# Patient Record
Sex: Female | Born: 1995 | Hispanic: Yes | Marital: Single | Smoking: Current every day smoker
Health system: Southern US, Community
[De-identification: ages and names within clinical notes are randomized; demographics above are authoritative.]

---
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
## 2018-12-15 ENCOUNTER — Other Ambulatory Visit: Payer: Self-pay

## 2018-12-15 ENCOUNTER — Emergency Department
Admission: EM | Admit: 2018-12-15 | Discharge: 2018-12-16 | Disposition: A | Discharge status: Home / Self Care | Payer: Medicaid Other | Attending: Emergency Medicine | Admitting: Emergency Medicine

## 2018-12-15 ENCOUNTER — Encounter: Payer: Self-pay | Admitting: Emergency Medicine

## 2018-12-15 DIAGNOSIS — Y907 Blood alcohol level of 200-239 mg/100 ml: Secondary | ICD-10-CM | POA: Insufficient documentation

## 2018-12-15 DIAGNOSIS — F10929 Alcohol use, unspecified with intoxication, unspecified: Secondary | ICD-10-CM | POA: Diagnosis not present

## 2018-12-15 DIAGNOSIS — E860 Dehydration: Secondary | ICD-10-CM | POA: Insufficient documentation

## 2018-12-15 DIAGNOSIS — R112 Nausea with vomiting, unspecified: Secondary | ICD-10-CM | POA: Diagnosis present

## 2018-12-15 DIAGNOSIS — F17210 Nicotine dependence, cigarettes, uncomplicated: Secondary | ICD-10-CM | POA: Insufficient documentation

## 2018-12-15 LAB — CBC WITH DIFFERENTIAL/PLATELET
Abs Immature Granulocytes: 0.09 10*3/uL — ABNORMAL HIGH (ref 0.00–0.07)
Basophils Absolute: 0.1 10*3/uL (ref 0.0–0.1)
Basophils Relative: 1 %
Eosinophils Absolute: 0.2 10*3/uL (ref 0.0–0.5)
Eosinophils Relative: 2 %
HCT: 40.4 % (ref 36.0–46.0)
Hemoglobin: 13.2 g/dL (ref 12.0–15.0)
Immature Granulocytes: 1 %
Lymphocytes Relative: 30 %
Lymphs Abs: 3.3 10*3/uL (ref 0.7–4.0)
MCH: 28.6 pg (ref 26.0–34.0)
MCHC: 32.7 g/dL (ref 30.0–36.0)
MCV: 87.4 fL (ref 80.0–100.0)
Monocytes Absolute: 0.7 10*3/uL (ref 0.1–1.0)
Monocytes Relative: 6 %
Neutro Abs: 6.7 10*3/uL (ref 1.7–7.7)
Neutrophils Relative %: 60 %
Platelets: 393 10*3/uL (ref 150–400)
RBC: 4.62 MIL/uL (ref 3.87–5.11)
RDW: 13.2 % (ref 11.5–15.5)
WBC: 11 10*3/uL — ABNORMAL HIGH (ref 4.0–10.5)
nRBC: 0 % (ref 0.0–0.2)

## 2018-12-15 LAB — COMPREHENSIVE METABOLIC PANEL
ALT: 21 U/L (ref 0–44)
AST: 17 U/L (ref 15–41)
Albumin: 4.2 g/dL (ref 3.5–5.0)
Alkaline Phosphatase: 111 U/L (ref 38–126)
Anion gap: 11 (ref 5–15)
BUN: 11 mg/dL (ref 6–20)
CO2: 22 mmol/L (ref 22–32)
Calcium: 8.7 mg/dL — ABNORMAL LOW (ref 8.9–10.3)
Chloride: 110 mmol/L (ref 98–111)
Creatinine, Ser: 0.53 mg/dL (ref 0.44–1.00)
GFR calc Af Amer: >60 mL/min (ref >60)
GFR calc non Af Amer: >60 mL/min (ref >60)
Glucose, Bld: 109 mg/dL — ABNORMAL HIGH (ref 70–99)
Potassium: 3.3 mmol/L — ABNORMAL LOW (ref 3.5–5.1)
Sodium: 143 mmol/L (ref 135–145)
Total Bilirubin: 0.6 mg/dL (ref 0.3–1.2)
Total Protein: 8.5 g/dL — ABNORMAL HIGH (ref 6.5–8.1)

## 2018-12-15 LAB — HCG, QUANTITATIVE, PREGNANCY: hCG, Beta Chain, Quant, S: 1 m[IU]/mL (ref <5)

## 2018-12-15 LAB — ETHANOL: Alcohol, Ethyl (B): 200 mg/dL — ABNORMAL HIGH (ref <10)

## 2018-12-15 MED ORDER — SODIUM CHLORIDE 0.9 % IV BOLUS
1000.0000 mL | Freq: Once | INTRAVENOUS | Status: AC
Start: 2018-12-15 — End: 2018-12-16
  Administered 2018-12-15: 1000 mL via INTRAVENOUS

## 2018-12-15 MED ORDER — AMMONIA AROMATIC IN INHA
RESPIRATORY_TRACT | Status: AC
  Filled 2018-12-15: qty 10

## 2018-12-15 MED ORDER — AMMONIA AROMATIC IN INHA
0.3000 mL | Freq: Once | RESPIRATORY_TRACT | Status: AC
  Administered 2018-12-15: 22:00:00 0.3 mL via RESPIRATORY_TRACT

## 2018-12-15 MED ORDER — ONDANSETRON HCL 4 MG/2ML IJ SOLN
4.0000 mg | Freq: Once | INTRAMUSCULAR | Status: DC
  Filled 2018-12-15: qty 2
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
## 2018-12-15 NOTE — ED Triage Notes (Signed)
Pt arrives with husband with ETOH on board. Pt split one 750 oz bottle of tequila and six beers with her husband. Pt has vomited x 3 prior to arrival. Pt ate pizza this Am with some chicken wings and various other things from [employer]. Pt is unresponsive to sternal stimulus at this time.

## 2018-12-15 NOTE — ED Notes (Signed)
Pt pulled out IV. EDP made aware.

## 2018-12-16 NOTE — ED Provider Notes (Signed)
Patient ambulating in hall with a steady gait.  Appears clinically sober as well as stable and appropriate for discharge home.   Willy Eddy, MD 12/16/18 [phone]

## 2019-02-13 ENCOUNTER — Emergency Department
Admission: EM | Admit: 2019-02-13 | Discharge: 2019-02-13 | Disposition: A | Discharge status: Home / Self Care | Payer: Medicaid Other | Attending: Emergency Medicine | Admitting: Emergency Medicine

## 2019-02-13 ENCOUNTER — Other Ambulatory Visit: Payer: Self-pay

## 2019-02-13 ENCOUNTER — Encounter: Payer: Self-pay | Admitting: Emergency Medicine

## 2019-02-13 DIAGNOSIS — R509 Fever, unspecified: Secondary | ICD-10-CM | POA: Diagnosis not present

## 2019-02-13 LAB — CBC
HCT: 38.7 % (ref 36.0–46.0)
Hemoglobin: 12.7 g/dL (ref 12.0–15.0)
MCH: 28.8 pg (ref 26.0–34.0)
MCHC: 32.8 g/dL (ref 30.0–36.0)
MCV: 87.8 fL (ref 80.0–100.0)
Platelets: 302 10*3/uL (ref 150–400)
RBC: 4.41 MIL/uL (ref 3.87–5.11)
RDW: 13.3 % (ref 11.5–15.5)
WBC: 4.3 10*3/uL (ref 4.0–10.5)
nRBC: 0 % (ref 0.0–0.2)

## 2019-02-13 LAB — URINALYSIS, COMPLETE (UACMP) WITH MICROSCOPIC
Bilirubin Urine: NEGATIVE
Glucose, UA: NEGATIVE mg/dL
Hgb urine dipstick: NEGATIVE
Ketones, ur: NEGATIVE mg/dL
Leukocytes,Ua: NEGATIVE
Nitrite: NEGATIVE
Protein, ur: NEGATIVE mg/dL
Specific Gravity, Urine: 1.029 (ref 1.005–1.030)
pH: 6 (ref 5.0–8.0)

## 2019-02-13 LAB — COMPREHENSIVE METABOLIC PANEL
ALT: 26 U/L (ref 0–44)
AST: 24 U/L (ref 15–41)
Albumin: 4.2 g/dL (ref 3.5–5.0)
Alkaline Phosphatase: 86 U/L (ref 38–126)
Anion gap: 11 (ref 5–15)
BUN: 11 mg/dL (ref 6–20)
CO2: 24 mmol/L (ref 22–32)
Calcium: 8.7 mg/dL — ABNORMAL LOW (ref 8.9–10.3)
Chloride: 106 mmol/L (ref 98–111)
Creatinine, Ser: 0.65 mg/dL (ref 0.44–1.00)
GFR calc Af Amer: >60 mL/min (ref >60)
GFR calc non Af Amer: >60 mL/min (ref >60)
Glucose, Bld: 117 mg/dL — ABNORMAL HIGH (ref 70–99)
Potassium: 3.5 mmol/L (ref 3.5–5.1)
Sodium: 141 mmol/L (ref 135–145)
Total Bilirubin: 0.5 mg/dL (ref 0.3–1.2)
Total Protein: 7.8 g/dL (ref 6.5–8.1)

## 2019-02-13 LAB — HCG, QUANTITATIVE, PREGNANCY: hCG, Beta Chain, Quant, S: <1 m[IU]/mL (ref <5)

## 2019-02-13 LAB — POCT PREGNANCY, URINE: Preg Test, Ur: NEGATIVE

## 2019-02-13 LAB — LIPASE, BLOOD: Lipase: 31 U/L (ref 11–51)

## 2019-02-13 MED ORDER — ONDANSETRON HCL 4 MG/2ML IJ SOLN: 4.0000 mg | Freq: Once | INTRAMUSCULAR | Status: DC | PRN

## 2019-02-13 MED ORDER — SODIUM CHLORIDE 0.9% FLUSH: 3.0000 mL | Freq: Once | INTRAVENOUS | Status: DC

## 2019-02-13 NOTE — ED Triage Notes (Signed)
Pt here with c/o vomiting and fever for 3 days, vomiting in triage, states fever 103 yesterday, not concerned for pregnancy, appears in NAD.

## 2019-02-13 NOTE — ED Notes (Signed)
This RN asked if pt wanted something for nausea, pt states she thinks she is okay without it.

## 2020-02-12 ENCOUNTER — Other Ambulatory Visit: Payer: Self-pay

## 2020-02-12 ENCOUNTER — Emergency Department
Admission: EM | Admit: 2020-02-12 | Discharge: 2020-02-12 | Disposition: A | Discharge status: Home / Self Care | Payer: Medicaid Other | Attending: Emergency Medicine | Admitting: Emergency Medicine

## 2020-02-12 ENCOUNTER — Emergency Department: Payer: Medicaid Other

## 2020-02-12 DIAGNOSIS — M25562 Pain in left knee: Secondary | ICD-10-CM | POA: Diagnosis not present

## 2020-02-12 DIAGNOSIS — Y9301 Activity, walking, marching and hiking: Secondary | ICD-10-CM | POA: Diagnosis not present

## 2020-02-12 DIAGNOSIS — Y998 Other external cause status: Secondary | ICD-10-CM | POA: Insufficient documentation

## 2020-02-12 DIAGNOSIS — Y92410 Unspecified street and highway as the place of occurrence of the external cause: Secondary | ICD-10-CM | POA: Diagnosis not present

## 2020-02-12 DIAGNOSIS — Z5321 Procedure and treatment not carried out due to patient leaving prior to being seen by health care provider: Secondary | ICD-10-CM | POA: Insufficient documentation

## 2020-02-12 NOTE — ED Notes (Signed)
Mom updated per pt request.

## 2020-02-12 NOTE — ED Notes (Signed)
No answer when called several times from lobby 
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
## 2020-08-05 IMAGING — CR DG KNEE 1-2V*L*
2 series · 2 of 2 positions shown · non-contrast
Comparison: None.

CLINICAL DATA: Pain, fall

EXAM:
LEFT KNEE - 1-2 VIEW

[knee ap]
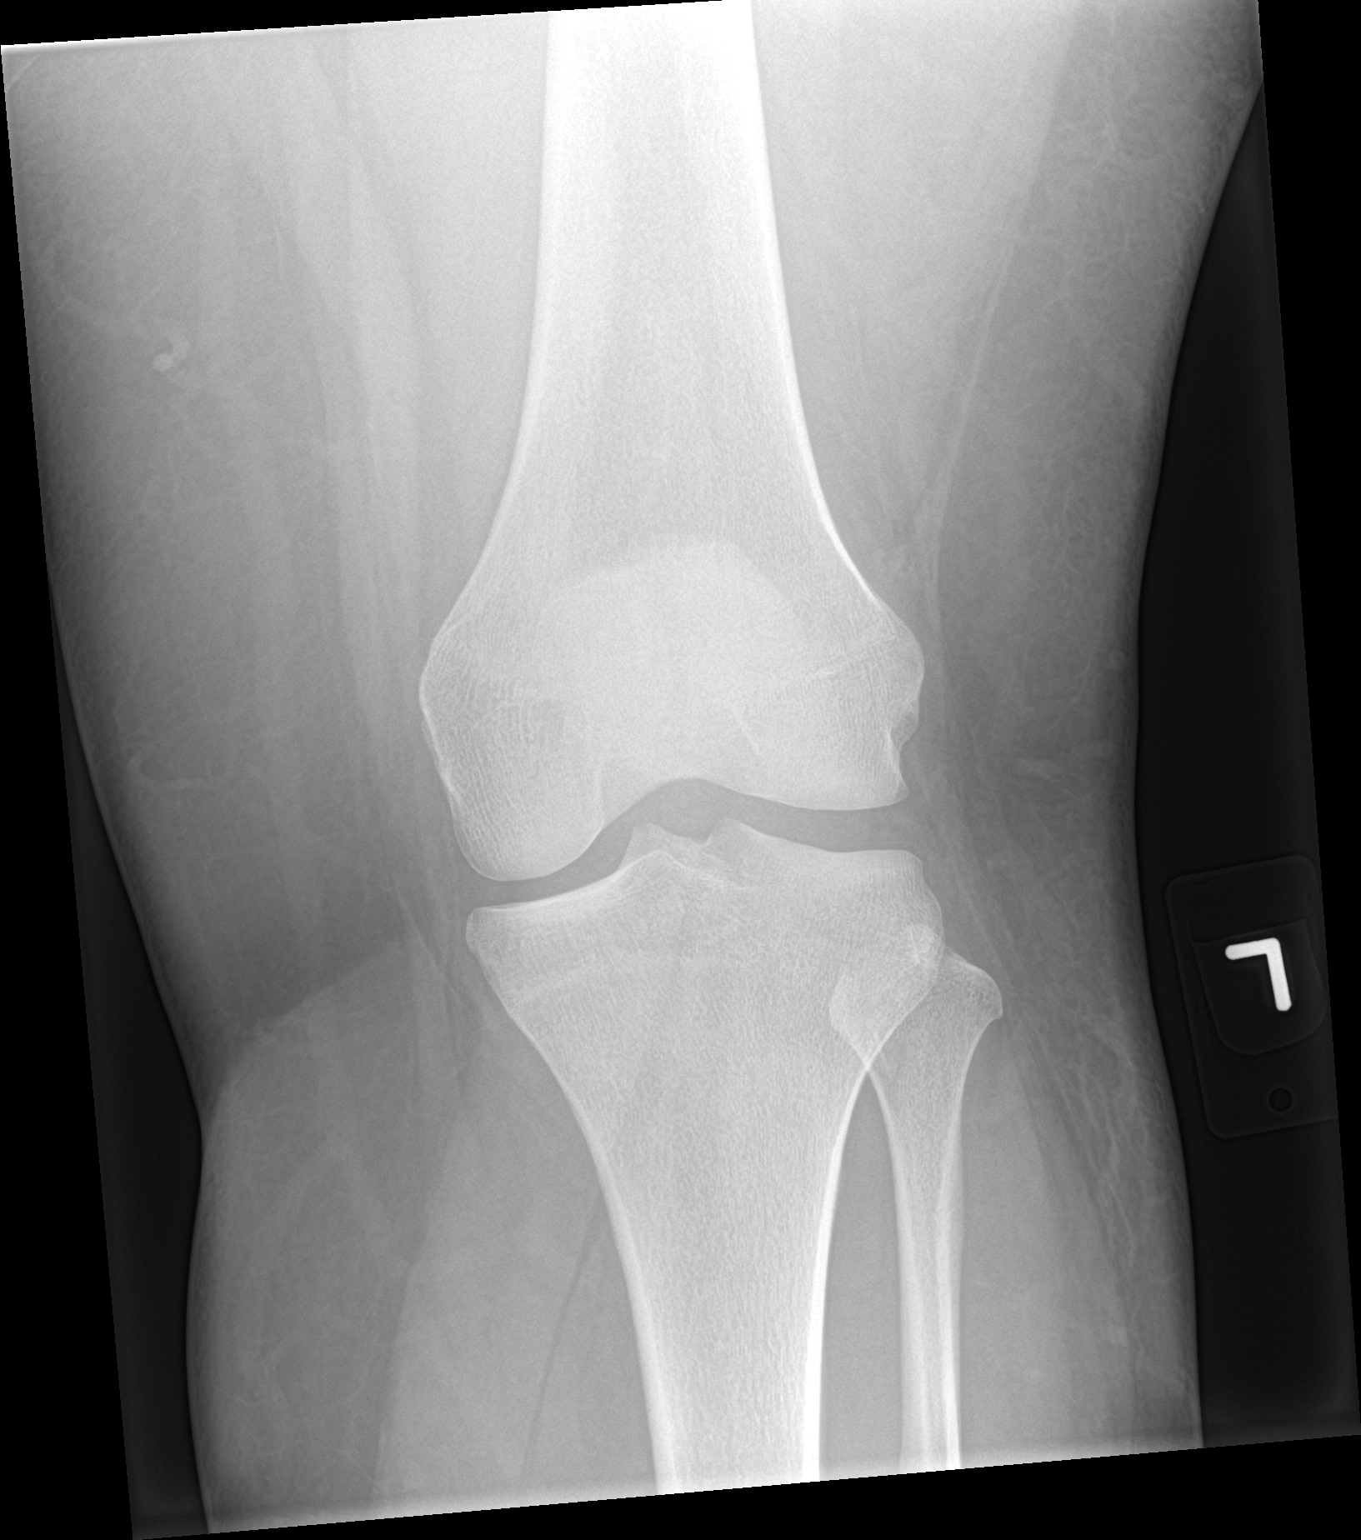

[knee lat]
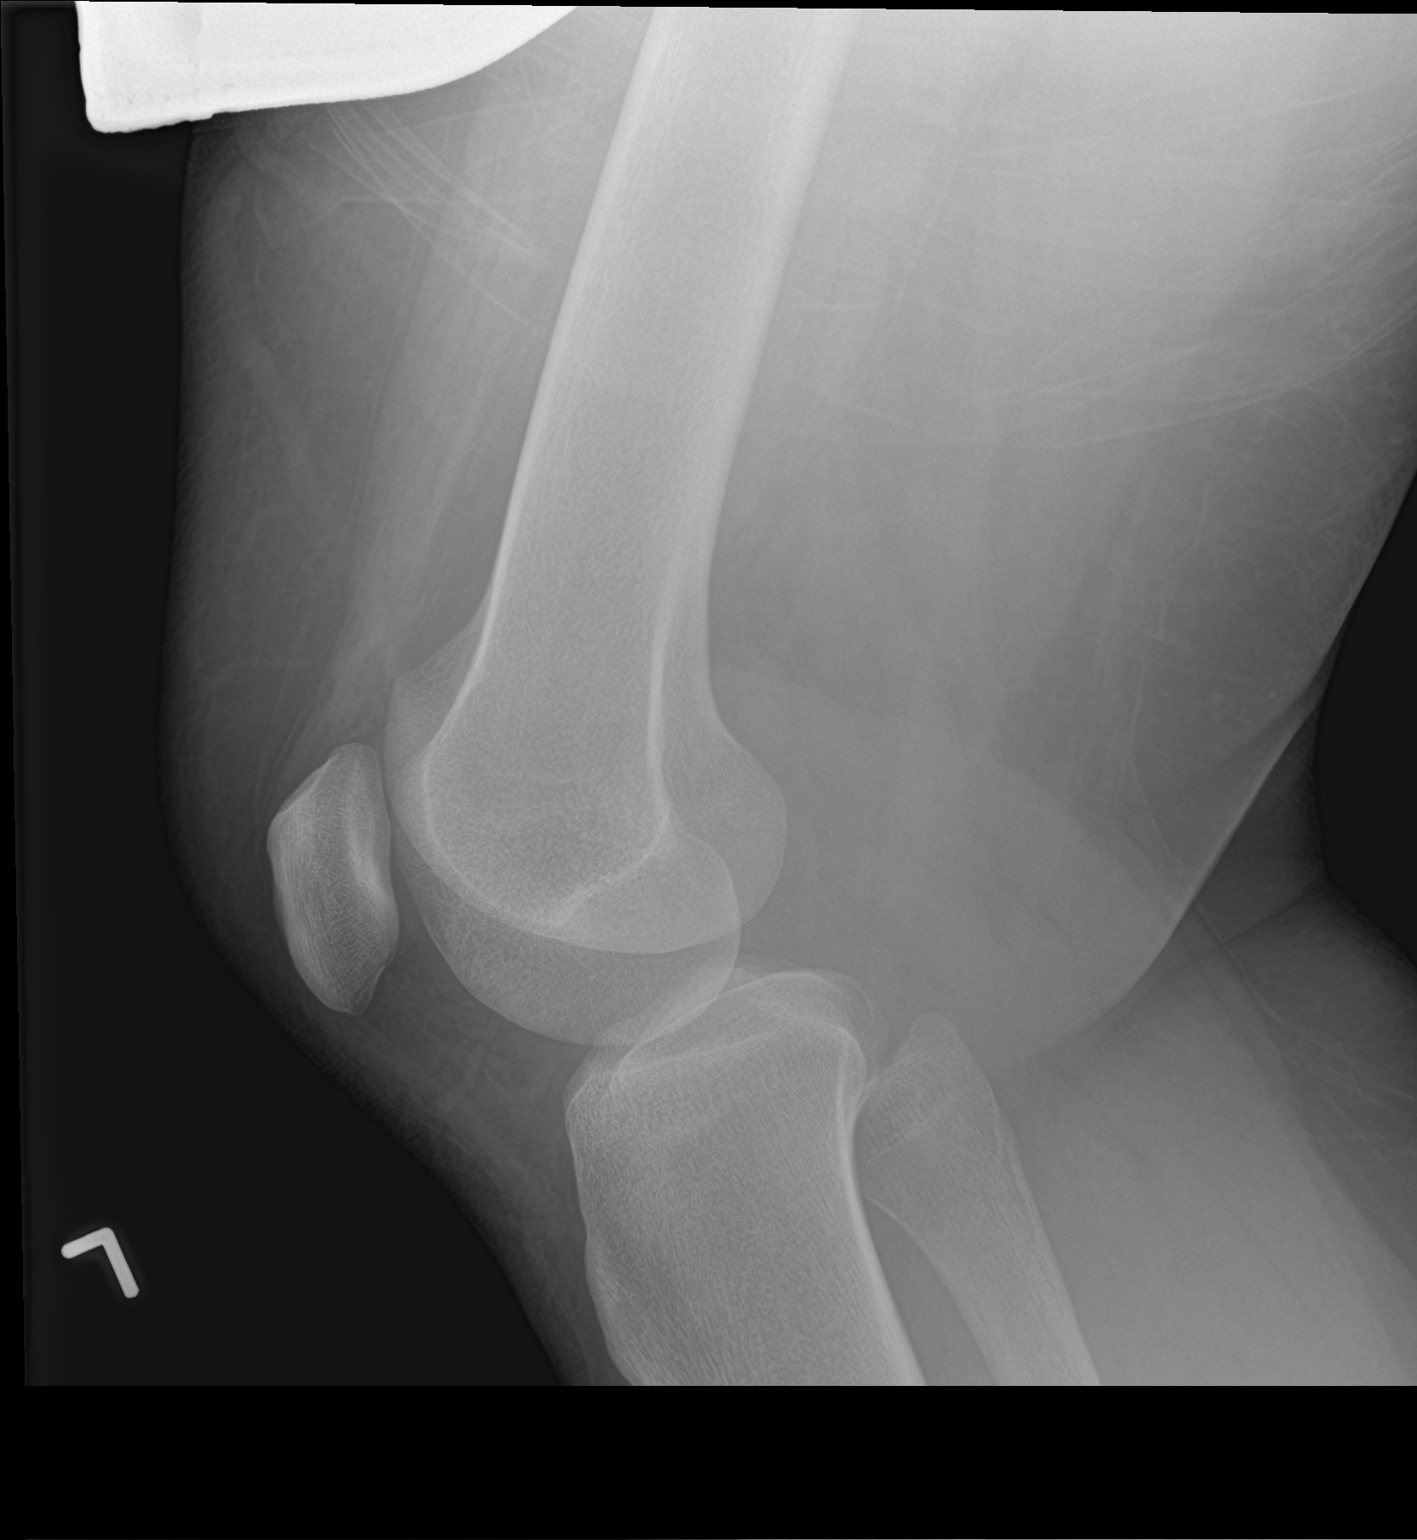

[2 of 2 positions shown; findings below may reference images not displayed]

FINDINGS: No evidence of fracture, dislocation, or joint effusion. No evidence
of arthropathy or other focal bone abnormality. Soft tissues are
unremarkable.
IMPRESSION: Negative.

## 2023-11-23 ENCOUNTER — Ambulatory Visit (HOSPITAL_BASED_OUTPATIENT_CLINIC_OR_DEPARTMENT_OTHER): Admitting: Family Medicine

## 2024-03-30 ENCOUNTER — Encounter: Payer: Self-pay | Admitting: Emergency Medicine

## 2024-03-30 ENCOUNTER — Ambulatory Visit
Admission: EM | Admit: 2024-03-30 | Discharge: 2024-03-30 | Disposition: A | Discharge status: Home / Self Care | Attending: Emergency Medicine | Admitting: Emergency Medicine

## 2024-03-30 DIAGNOSIS — N898 Other specified noninflammatory disorders of vagina: Secondary | ICD-10-CM | POA: Insufficient documentation

## 2024-03-30 LAB — POCT URINE DIPSTICK
Bilirubin, UA: NEGATIVE
Blood, UA: NEGATIVE
Glucose, UA: NEGATIVE mg/dL
Ketones, POC UA: NEGATIVE mg/dL
Leukocytes, UA: NEGATIVE
Nitrite, UA: NEGATIVE
POC PROTEIN,UA: 30 — AB
Spec Grav, UA: >=1.03 — AB (ref 1.010–1.025)
Urobilinogen, UA: 1 U/dL
pH, UA: 6.5 (ref 5.0–8.0)

## 2024-03-30 MED ORDER — FLUCONAZOLE 150 MG PO TABS: 150.0000 mg | ORAL_TABLET | ORAL | 0 refills | Status: AC | PRN

## 2024-03-30 NOTE — Discharge Instructions (Signed)
 Today you are being treated  for yeast.   Checking for yeast and bacterial vaginosis pending for 2 to 3 days, you will be notified of positive test results online additional treatment sent to the pharmacy as needed  Take diflucan  150 mg once, if symptoms still present in 3 days then you may take second pill   Yeast infections which are caused by a naturally occurring fungus called candida. Vaginosis is an inflammation of the vagina that can result in discharge, itching and pain. The cause is usually a change in the normal balance of vaginal bacteria or an infection. Vaginosis can also result from reduced estrogen levels after menopause.  Labs pending 2-3 days, you will be contacted if positive for any sti and treatment will be sent to the pharmacy, you will have to return to the clinic if positive for gonorrhea to receive treatment   Urinalysis negative for bladder infection, has been sent to the lab to see if bacteria will grow, you will be notified if this occurs and antibiotic started   In addition:   Avoid baths, hot tubs and whirlpool spas.  Don't use scented or harsh soaps, such as those with deodorant or antibacterial action. Avoid irritants. These include scented tampons and pads. Wipe from front to back after using the toilet.  Don't douche. Your vagina doesn't require cleansing other than normal bathing.  Use a  condom. Wear cotton underwear, this fabric helps absorb moisture

## 2024-03-31 LAB — URINE CULTURE: Culture: NO GROWTH

## 2024-03-31 NOTE — ED Provider Notes (Signed)
 Alexandria Rice    CSN: [id number] Arrival date & time: 03/30/24  1539      History   Chief Complaint Chief Complaint  Patient presents with   Vaginal Discharge    HPI Alexandria Rice is a 28 y.o. female.   Patient presents for evaluation of vaginal irritation, dysuria only occurring in the morning and urinary frequency present for 2 days.  Has not attempted treatment.  Denies sexual activity.  Denies vaginal discharge, itching or odor, abdominal, flank pain or hematuria.  Last menstrual period 1 to 2 weeks ago.  History reviewed. No pertinent past medical history.  There are no active problems to display for this patient.   History reviewed. No pertinent surgical history.  OB History   No obstetric history on file.      Home Medications    Prior to Admission medications   Medication Sig Start Date End Date Taking? Authorizing Provider  fluconazole  (DIFLUCAN ) 150 MG tablet Take 1 tablet (150 mg total) by mouth every three (3) days as needed for up to 2 doses. 03/30/24  Yes Teresa Shelba SAUNDERS, NP    Family History History reviewed. No pertinent family history.  Social History Social History   Tobacco Use   Smoking status: Every Day   Smokeless tobacco: Never  Substance Use Topics   Alcohol use: Yes    Alcohol/week: 2.0 standard drinks of alcohol    Types: 2 Cans of beer per week   Drug use: Never     Allergies   Patient has no known allergies.   Review of Systems Review of Systems  Genitourinary:  Positive for vaginal discharge.     Physical Exam Triage Vital Signs ED Triage Vitals [03/30/24 1603]  Encounter Vitals Group     BP 130/80     Girls Systolic BP Percentile      Girls Diastolic BP Percentile      Boys Systolic BP Percentile      Boys Diastolic BP Percentile      Pulse Rate 90     Resp 18     Temp 98.7 F (37.1 C)     Temp Source Oral     SpO2 99 %     Weight      Height      Head Circumference      Peak Flow      Pain  Score 0     Pain Loc      Pain Education      Exclude from Growth Chart    No data found.  Updated Vital Signs BP 130/80 (BP Location: Left Arm)   Pulse 90   Temp 98.7 F (37.1 C) (Oral)   Resp 18   LMP 03/21/2024 (Exact Date)   SpO2 99%   Visual Acuity Right Eye Distance:   Left Eye Distance:   Bilateral Distance:    Right Eye Near:   Left Eye Near:    Bilateral Near:     Physical Exam Constitutional:      Appearance: Normal appearance.  Eyes:     Extraocular Movements: Extraocular movements intact.  Abdominal:     Tenderness: There is abdominal tenderness in the suprapubic area. There is no right CVA tenderness, left CVA tenderness or guarding.  Genitourinary:    Comments: deferred Neurological:     Mental Status: She is alert and oriented to person, place, and time.      UC Treatments / Results  Labs (all labs ordered  are listed, but only abnormal results are displayed) Labs Reviewed  POCT URINE DIPSTICK - Abnormal; Notable for the following components:      Result Value   Spec Grav, UA >=1.030 (*)    POC PROTEIN,UA =30 (*)    All other components within normal limits  URINE CULTURE  CERVICOVAGINAL ANCILLARY ONLY    EKG   Radiology No results found.  Procedures Procedures (including critical care time)  Medications Ordered in UC Medications - No data to display  Initial Impression / Assessment and Plan / UC Course  I have reviewed the triage vital signs and the nursing notes.  Pertinent labs & imaging results that were available during my care of the patient were reviewed by me and considered in my medical decision making (see chart for details).  Vaginal irritation  Urinalysis negative, sent for culture, discussed findings, empirically treating for yeast with Diflucan  while vaginal swab checking for yeast and bacterial vaginosis pending, recommended nonpharmacological supportive measures and advised follow-up if symptoms persist or  worsen Final Clinical Impressions(s) / UC Diagnoses   Final diagnoses:  Vaginal irritation     Discharge Instructions      Today you are being treated  for yeast.   Checking for yeast and bacterial vaginosis pending for 2 to 3 days, you will be notified of positive test results online additional treatment sent to the pharmacy as needed  Take diflucan  150 mg once, if symptoms still present in 3 days then you may take second pill   Yeast infections which are caused by a naturally occurring fungus called candida. Vaginosis is an inflammation of the vagina that can result in discharge, itching and pain. The cause is usually a change in the normal balance of vaginal bacteria or an infection. Vaginosis can also result from reduced estrogen levels after menopause.  Labs pending 2-3 days, you will be contacted if positive for any sti and treatment will be sent to the pharmacy, you will have to return to the clinic if positive for gonorrhea to receive treatment   Urinalysis negative for bladder infection, has been sent to the lab to see if bacteria will grow, you will be notified if this occurs and antibiotic started   In addition:   Avoid baths, hot tubs and whirlpool spas.  Don't use scented or harsh soaps, such as those with deodorant or antibacterial action. Avoid irritants. These include scented tampons and pads. Wipe from front to back after using the toilet.  Don't douche. Your vagina doesn't require cleansing other than normal bathing.  Use a  condom. Wear cotton underwear, this fabric helps absorb moisture     ED Prescriptions     Medication Sig Dispense Auth. Provider   fluconazole  (DIFLUCAN ) 150 MG tablet Take 1 tablet (150 mg total) by mouth every three (3) days as needed for up to 2 doses. 2 tablet Yudith Norlander R, NP      PDMP not reviewed this encounter.   Teresa Shelba SAUNDERS, [place] 03/31/24 [phone]

## 2024-04-01 ENCOUNTER — Ambulatory Visit (HOSPITAL_COMMUNITY): Payer: Self-pay

## 2024-04-01 LAB — CERVICOVAGINAL ANCILLARY ONLY
Bacterial Vaginitis (gardnerella): NEGATIVE
Candida Glabrata: NEGATIVE
Candida Vaginitis: NEGATIVE
Comment: NEGATIVE
Comment: NEGATIVE
Comment: NEGATIVE
